# Patient Record
Sex: Female | Born: 1960 | Race: Black or African American | Hispanic: No | Marital: Married
Health system: Southern US, Community
[De-identification: ages and names within clinical notes are randomized; demographics above are authoritative.]

---

## 2000-06-07 ENCOUNTER — Encounter: Payer: Self-pay | Admitting: Emergency Medicine

## 2000-06-07 ENCOUNTER — Emergency Department (HOSPITAL_COMMUNITY): Admission: EM | Admit: 2000-06-07 | Discharge: 2000-06-07 | Payer: Self-pay | Admitting: Emergency Medicine

## 2000-08-26 ENCOUNTER — Ambulatory Visit (HOSPITAL_COMMUNITY): Admission: RE | Admit: 2000-08-26 | Discharge: 2000-08-26 | Payer: Self-pay | Admitting: Specialist

## 2003-05-20 ENCOUNTER — Emergency Department (HOSPITAL_COMMUNITY): Admission: EM | Admit: 2003-05-20 | Discharge: 2003-05-20 | Payer: Self-pay | Admitting: Emergency Medicine

## 2008-10-25 ENCOUNTER — Emergency Department (HOSPITAL_COMMUNITY): Admission: EM | Admit: 2008-10-25 | Discharge: 2008-10-25 | Payer: Self-pay | Admitting: Emergency Medicine

## 2010-10-14 LAB — DIFFERENTIAL
Basophils Absolute: 0 10*3/uL (ref 0.0–0.1)
Basophils Relative: 0 % (ref 0–1)
Eosinophils Absolute: 0.1 10*3/uL (ref 0.0–0.7)
Neutro Abs: 3.2 10*3/uL (ref 1.7–7.7)
Neutrophils Relative %: 61 % (ref 43–77)

## 2010-10-14 LAB — COMPREHENSIVE METABOLIC PANEL
ALT: 22 U/L (ref 0–35)
Alkaline Phosphatase: 101 U/L (ref 39–117)
BUN: 8 mg/dL (ref 6–23)
CO2: 22 mEq/L (ref 19–32)
Chloride: 106 mEq/L (ref 96–112)
Glucose, Bld: 92 mg/dL (ref 70–99)
Potassium: 4.4 mEq/L (ref 3.5–5.1)
Sodium: 139 mEq/L (ref 135–145)
Total Bilirubin: 1.3 mg/dL — ABNORMAL HIGH (ref 0.3–1.2)

## 2010-10-14 LAB — CBC
HCT: 43.1 % (ref 36.0–46.0)
Hemoglobin: 14.5 g/dL (ref 12.0–15.0)
RBC: 5.38 MIL/uL — ABNORMAL HIGH (ref 3.87–5.11)
WBC: 5.3 10*3/uL (ref 4.0–10.5)

## 2020-01-14 ENCOUNTER — Emergency Department (HOSPITAL_COMMUNITY): Payer: 59

## 2020-01-14 ENCOUNTER — Observation Stay (HOSPITAL_COMMUNITY): Payer: 59

## 2020-01-14 ENCOUNTER — Observation Stay (HOSPITAL_COMMUNITY)
Admission: EM | Admit: 2020-01-14 | Discharge: 2020-01-15 | Disposition: A | Discharge status: Home / Self Care | Payer: 59 | Attending: Internal Medicine | Admitting: Internal Medicine

## 2020-01-14 ENCOUNTER — Encounter (HOSPITAL_COMMUNITY): Payer: Self-pay | Admitting: Emergency Medicine

## 2020-01-14 DIAGNOSIS — I517 Cardiomegaly: Secondary | ICD-10-CM | POA: Diagnosis present

## 2020-01-14 DIAGNOSIS — Z20822 Contact with and (suspected) exposure to covid-19: Secondary | ICD-10-CM | POA: Diagnosis not present

## 2020-01-14 DIAGNOSIS — S40022A Contusion of left upper arm, initial encounter: Secondary | ICD-10-CM | POA: Diagnosis not present

## 2020-01-14 DIAGNOSIS — E876 Hypokalemia: Secondary | ICD-10-CM | POA: Diagnosis not present

## 2020-01-14 DIAGNOSIS — Y999 Unspecified external cause status: Secondary | ICD-10-CM | POA: Insufficient documentation

## 2020-01-14 DIAGNOSIS — R000 Tachycardia, unspecified: Secondary | ICD-10-CM | POA: Diagnosis not present

## 2020-01-14 DIAGNOSIS — Y92410 Unspecified street and highway as the place of occurrence of the external cause: Secondary | ICD-10-CM | POA: Insufficient documentation

## 2020-01-14 DIAGNOSIS — Y939 Activity, unspecified: Secondary | ICD-10-CM | POA: Insufficient documentation

## 2020-01-14 DIAGNOSIS — R569 Unspecified convulsions: Secondary | ICD-10-CM

## 2020-01-14 DIAGNOSIS — S4992XA Unspecified injury of left shoulder and upper arm, initial encounter: Secondary | ICD-10-CM | POA: Diagnosis present

## 2020-01-14 DIAGNOSIS — I10 Essential (primary) hypertension: Secondary | ICD-10-CM

## 2020-01-14 DIAGNOSIS — R932 Abnormal findings on diagnostic imaging of liver and biliary tract: Secondary | ICD-10-CM | POA: Diagnosis present

## 2020-01-14 DIAGNOSIS — R519 Headache, unspecified: Secondary | ICD-10-CM | POA: Diagnosis not present

## 2020-01-14 LAB — COMPREHENSIVE METABOLIC PANEL WITH GFR
ALT: 10 U/L (ref 0–44)
AST: 18 U/L (ref 15–41)
Albumin: 4 g/dL (ref 3.5–5.0)
Alkaline Phosphatase: 78 U/L (ref 38–126)
Anion gap: 13 (ref 5–15)
BUN: 8 mg/dL (ref 6–20)
CO2: 22 mmol/L (ref 22–32)
Calcium: 9.4 mg/dL (ref 8.9–10.3)
Chloride: 105 mmol/L (ref 98–111)
Creatinine, Ser: 0.75 mg/dL (ref 0.44–1.00)
GFR calc Af Amer: >60 mL/min
GFR calc non Af Amer: >60 mL/min
Glucose, Bld: 89 mg/dL (ref 70–99)
Potassium: 3.2 mmol/L — ABNORMAL LOW (ref 3.5–5.1)
Sodium: 140 mmol/L (ref 135–145)
Total Bilirubin: 0.9 mg/dL (ref 0.3–1.2)
Total Protein: 7.4 g/dL (ref 6.5–8.1)

## 2020-01-14 LAB — RAPID URINE DRUG SCREEN, HOSP PERFORMED
Amphetamines: NOT DETECTED
Barbiturates: NOT DETECTED
Benzodiazepines: POSITIVE — AB
Cocaine: NOT DETECTED
Opiates: NOT DETECTED
Tetrahydrocannabinol: NOT DETECTED

## 2020-01-14 LAB — I-STAT CHEM 8, ED
BUN: 9 mg/dL (ref 6–20)
Calcium, Ion: 1.03 mmol/L — ABNORMAL LOW (ref 1.15–1.40)
Chloride: 106 mmol/L (ref 98–111)
Creatinine, Ser: 0.7 mg/dL (ref 0.44–1.00)
Glucose, Bld: 84 mg/dL (ref 70–99)
HCT: 43 % (ref 36.0–46.0)
Hemoglobin: 14.6 g/dL (ref 12.0–15.0)
Potassium: 3.2 mmol/L — ABNORMAL LOW (ref 3.5–5.1)
Sodium: 142 mmol/L (ref 135–145)
TCO2: 23 mmol/L (ref 22–32)

## 2020-01-14 LAB — CBC
HCT: 43.1 % (ref 36.0–46.0)
Hemoglobin: 13.6 g/dL (ref 12.0–15.0)
MCH: 26.4 pg (ref 26.0–34.0)
MCHC: 31.6 g/dL (ref 30.0–36.0)
MCV: 83.5 fL (ref 80.0–100.0)
Platelets: 252 10*3/uL (ref 150–400)
RBC: 5.16 MIL/uL — ABNORMAL HIGH (ref 3.87–5.11)
RDW: 13.7 % (ref 11.5–15.5)
WBC: 6.1 10*3/uL (ref 4.0–10.5)
nRBC: 0 % (ref 0.0–0.2)

## 2020-01-14 LAB — URINALYSIS, ROUTINE W REFLEX MICROSCOPIC
Bilirubin Urine: NEGATIVE
Glucose, UA: NEGATIVE mg/dL
Hgb urine dipstick: NEGATIVE
Ketones, ur: NEGATIVE mg/dL
Leukocytes,Ua: NEGATIVE
Nitrite: NEGATIVE
Protein, ur: 30 mg/dL — AB
Specific Gravity, Urine: 1.01 (ref 1.005–1.030)
pH: 7 (ref 5.0–8.0)

## 2020-01-14 LAB — URINALYSIS, MICROSCOPIC (REFLEX)
Bacteria, UA: NONE SEEN
RBC / HPF: NONE SEEN RBC/hpf (ref 0–5)

## 2020-01-14 LAB — SAMPLE TO BLOOD BANK

## 2020-01-14 LAB — ETHANOL: Alcohol, Ethyl (B): <10 mg/dL (ref <10)

## 2020-01-14 LAB — PROTIME-INR
INR: 0.9 (ref 0.8–1.2)
Prothrombin Time: 12.1 seconds (ref 11.4–15.2)

## 2020-01-14 LAB — TSH: TSH: 1.988 u[IU]/mL (ref 0.350–4.500)

## 2020-01-14 LAB — SARS CORONAVIRUS 2 BY RT PCR (HOSPITAL ORDER, PERFORMED IN ~~LOC~~ HOSPITAL LAB): SARS Coronavirus 2: NEGATIVE

## 2020-01-14 LAB — MAGNESIUM: Magnesium: 1.9 mg/dL (ref 1.7–2.4)

## 2020-01-14 LAB — LACTIC ACID, PLASMA: Lactic Acid, Venous: 2.2 mmol/L (ref 0.5–1.9)

## 2020-01-14 MED ORDER — POTASSIUM CHLORIDE CRYS ER 20 MEQ PO TBCR
40.0000 meq | EXTENDED_RELEASE_TABLET | Freq: Once | ORAL | Status: AC
  Administered 2020-01-14: 40 meq via ORAL
  Filled 2020-01-14: qty 2

## 2020-01-14 MED ORDER — FENTANYL CITRATE (PF) 100 MCG/2ML IJ SOLN
50.0000 ug | Freq: Once | INTRAMUSCULAR | Status: AC
  Administered 2020-01-14: 50 ug via INTRAVENOUS

## 2020-01-14 MED ORDER — ACETAMINOPHEN 500 MG PO TABS
1000.0000 mg | ORAL_TABLET | Freq: Once | ORAL | Status: AC
  Administered 2020-01-14: 1000 mg via ORAL
  Filled 2020-01-14: qty 2

## 2020-01-14 MED ORDER — ENOXAPARIN SODIUM 60 MG/0.6ML ~~LOC~~ SOLN
50.0000 mg | SUBCUTANEOUS | Status: DC
  Administered 2020-01-14: 50 mg via SUBCUTANEOUS
  Filled 2020-01-14: qty 0.5

## 2020-01-14 MED ORDER — GADOBUTROL 1 MMOL/ML IV SOLN
10.0000 mL | Freq: Once | INTRAVENOUS | Status: AC | PRN
  Administered 2020-01-14: 10 mL via INTRAVENOUS

## 2020-01-14 MED ORDER — LORAZEPAM 2 MG/ML IJ SOLN
1.0000 mg | Freq: Once | INTRAMUSCULAR | Status: AC
  Administered 2020-01-14: 1 mg via INTRAVENOUS
  Filled 2020-01-14: qty 1

## 2020-01-14 MED ORDER — METHOCARBAMOL 500 MG PO TABS: 1000.0000 mg | ORAL_TABLET | Freq: Three times a day (TID) | ORAL | Status: DC | PRN

## 2020-01-14 MED ORDER — LABETALOL HCL 5 MG/ML IV SOLN
20.0000 mg | Freq: Once | INTRAVENOUS | Status: DC
  Filled 2020-01-14: qty 4

## 2020-01-14 MED ORDER — HYDROMORPHONE HCL 1 MG/ML IJ SOLN: 1.0000 mg | INTRAMUSCULAR | Status: DC | PRN

## 2020-01-14 MED ORDER — SODIUM CHLORIDE 0.9 % IV SOLN: 75.0000 mL/h | INTRAVENOUS | Status: DC

## 2020-01-14 MED ORDER — IOHEXOL 300 MG/ML  SOLN
100.0000 mL | Freq: Once | INTRAMUSCULAR | Status: AC | PRN
  Administered 2020-01-14: 100 mL via INTRAVENOUS

## 2020-01-14 MED ORDER — ADULT MULTIVITAMIN W/MINERALS CH
1.0000 | ORAL_TABLET | Freq: Every day | ORAL | Status: DC
  Administered 2020-01-14 – 2020-01-15 (×2): 1 via ORAL
  Filled 2020-01-14 (×2): qty 1

## 2020-01-14 MED ORDER — LORAZEPAM 2 MG/ML IJ SOLN: 1.0000 mg | INTRAMUSCULAR | Status: DC | PRN
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
## 2020-01-14 NOTE — ED Notes (Signed)
Attempted to call report to 3W 
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
## 2020-01-15 ENCOUNTER — Observation Stay (HOSPITAL_COMMUNITY): Payer: 59

## 2020-01-15 DIAGNOSIS — R569 Unspecified convulsions: Secondary | ICD-10-CM | POA: Diagnosis not present

## 2020-01-15 DIAGNOSIS — I517 Cardiomegaly: Secondary | ICD-10-CM | POA: Diagnosis present

## 2020-01-15 DIAGNOSIS — R932 Abnormal findings on diagnostic imaging of liver and biliary tract: Secondary | ICD-10-CM | POA: Diagnosis present

## 2020-01-15 LAB — BASIC METABOLIC PANEL
Anion gap: 14 (ref 5–15)
BUN: 7 mg/dL (ref 6–20)
CO2: 21 mmol/L — ABNORMAL LOW (ref 22–32)
Calcium: 9.4 mg/dL (ref 8.9–10.3)
Chloride: 106 mmol/L (ref 98–111)
Creatinine, Ser: 0.71 mg/dL (ref 0.44–1.00)
GFR calc Af Amer: >60 mL/min (ref >60)
GFR calc non Af Amer: >60 mL/min (ref >60)
Glucose, Bld: 87 mg/dL (ref 70–99)
Potassium: 3.5 mmol/L (ref 3.5–5.1)
Sodium: 141 mmol/L (ref 135–145)

## 2020-01-15 LAB — CBC WITH DIFFERENTIAL/PLATELET
Abs Immature Granulocytes: 0.01 10*3/uL (ref 0.00–0.07)
Basophils Absolute: 0 10*3/uL (ref 0.0–0.1)
Basophils Relative: 1 %
Eosinophils Absolute: 0.1 10*3/uL (ref 0.0–0.5)
Eosinophils Relative: 1 %
HCT: 44.2 % (ref 36.0–46.0)
Hemoglobin: 14.1 g/dL (ref 12.0–15.0)
Immature Granulocytes: 0 %
Lymphocytes Relative: 29 %
Lymphs Abs: 1.5 10*3/uL (ref 0.7–4.0)
MCH: 26.2 pg (ref 26.0–34.0)
MCHC: 31.9 g/dL (ref 30.0–36.0)
MCV: 82 fL (ref 80.0–100.0)
Monocytes Absolute: 0.4 10*3/uL (ref 0.1–1.0)
Monocytes Relative: 7 %
Neutro Abs: 3.3 10*3/uL (ref 1.7–7.7)
Neutrophils Relative %: 62 %
Platelets: 283 10*3/uL (ref 150–400)
RBC: 5.39 MIL/uL — ABNORMAL HIGH (ref 3.87–5.11)
RDW: 13.8 % (ref 11.5–15.5)
WBC: 5.3 10*3/uL (ref 4.0–10.5)
nRBC: 0 % (ref 0.0–0.2)

## 2020-01-15 LAB — LIPID PANEL
Cholesterol: 219 mg/dL — ABNORMAL HIGH (ref 0–200)
HDL: 88 mg/dL (ref >40)
LDL Cholesterol: 117 mg/dL — ABNORMAL HIGH (ref 0–99)
Total CHOL/HDL Ratio: 2.5 RATIO
Triglycerides: 71 mg/dL (ref <150)
VLDL: 14 mg/dL (ref 0–40)

## 2020-01-15 LAB — HEMOGLOBIN A1C
Hgb A1c MFr Bld: 5.3 % (ref 4.8–5.6)
Mean Plasma Glucose: 105.41 mg/dL

## 2020-01-15 LAB — HIV ANTIBODY (ROUTINE TESTING W REFLEX): HIV Screen 4th Generation wRfx: NONREACTIVE

## 2020-01-15 MED ORDER — ACETAMINOPHEN 325 MG PO TABS: 650.0000 mg | ORAL_TABLET | Freq: Four times a day (QID) | ORAL | Status: DC | PRN

## 2020-01-15 MED ORDER — METHOCARBAMOL 500 MG PO TABS: 1000.0000 mg | ORAL_TABLET | Freq: Three times a day (TID) | ORAL | 0 refills | Status: AC | PRN

## 2020-01-15 MED ORDER — OXYCODONE HCL 5 MG PO TABS: 5.0000 mg | ORAL_TABLET | ORAL | Status: DC | PRN

## 2020-01-15 NOTE — Progress Notes (Signed)
Patient discharged home. I reviewed discharge orders about not driving and reviewed medications.
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
## 2020-01-15 NOTE — Plan of Care (Signed)
Adequate for discharge.
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
## 2020-01-15 NOTE — ED Notes (Signed)
Attempted to call report to 3W x2.  
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
## 2020-01-15 NOTE — Progress Notes (Signed)
Stat EEG,  EEG complete - results pending.
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
